# Patient Record
Sex: Female | Born: 1970 | Race: White | Hispanic: No | Marital: Single | Smoking: Never smoker
Health system: Southern US, Community
[De-identification: ages and names within clinical notes are randomized; demographics above are authoritative.]

## PROBLEM LIST (undated history)

## (undated) DIAGNOSIS — N83209 Unspecified ovarian cyst, unspecified side: Secondary | ICD-10-CM

---

## 2009-09-03 ENCOUNTER — Emergency Department: Payer: Self-pay | Admitting: Unknown Physician Specialty

## 2009-11-15 ENCOUNTER — Ambulatory Visit: Payer: Self-pay

## 2010-12-31 ENCOUNTER — Emergency Department: Payer: Self-pay | Admitting: *Deleted

## 2012-06-01 ENCOUNTER — Observation Stay: Payer: Self-pay | Admitting: Obstetrics & Gynecology

## 2012-06-01 LAB — URINALYSIS, COMPLETE
Bilirubin,UR: NEGATIVE
Blood: NEGATIVE
Glucose,UR: NEGATIVE mg/dL (ref 0–75)
Nitrite: NEGATIVE
Ph: 6 (ref 4.5–8.0)
Protein: NEGATIVE
Specific Gravity: 1.021 (ref 1.003–1.030)
Squamous Epithelial: 7
WBC UR: 13 /HPF (ref 0–5)

## 2012-06-01 LAB — DRUG SCREEN, URINE
Barbiturates, Ur Screen: NEGATIVE (ref ?–200)
Cocaine Metabolite,Ur ~~LOC~~: POSITIVE (ref ?–300)
MDMA (Ecstasy)Ur Screen: NEGATIVE (ref ?–500)
Methadone, Ur Screen: NEGATIVE (ref ?–300)
Opiate, Ur Screen: NEGATIVE (ref ?–300)
Phencyclidine (PCP) Ur S: NEGATIVE (ref ?–25)
Tricyclic, Ur Screen: NEGATIVE (ref ?–1000)

## 2012-07-25 ENCOUNTER — Observation Stay: Payer: Self-pay | Admitting: Obstetrics and Gynecology

## 2012-07-25 LAB — COMPREHENSIVE METABOLIC PANEL
Albumin: 2.4 g/dL — ABNORMAL LOW (ref 3.4–5.0)
Alkaline Phosphatase: 95 U/L (ref 50–136)
Anion Gap: 7 (ref 7–16)
Calcium, Total: 9.1 mg/dL (ref 8.5–10.1)
Chloride: 105 mmol/L (ref 98–107)
Co2: 25 mmol/L (ref 21–32)
Creatinine: 0.65 mg/dL (ref 0.60–1.30)
EGFR (Non-African Amer.): >60
Glucose: 74 mg/dL (ref 65–99)
SGOT(AST): 15 U/L (ref 15–37)
SGPT (ALT): 16 U/L (ref 12–78)
Sodium: 137 mmol/L (ref 136–145)
Total Protein: 6.4 g/dL (ref 6.4–8.2)

## 2012-07-25 LAB — LIPASE, BLOOD: Lipase: 109 U/L (ref 73–393)

## 2012-07-25 LAB — CBC WITH DIFFERENTIAL/PLATELET
Basophil #: 0 10*3/uL (ref 0.0–0.1)
Basophil %: 0.2 %
Eosinophil #: 0.2 10*3/uL (ref 0.0–0.7)
Eosinophil %: 2.2 %
HCT: 36.6 % (ref 35.0–47.0)
HGB: 13.1 g/dL (ref 12.0–16.0)
Lymphocyte %: 18.8 %
MCHC: 35.8 g/dL (ref 32.0–36.0)
MCV: 90 fL (ref 80–100)
Monocyte #: 0.8 x10 3/mm (ref 0.2–0.9)
Monocyte %: 8.6 %
Neutrophil #: 6.9 10*3/uL — ABNORMAL HIGH (ref 1.4–6.5)
Platelet: 202 10*3/uL (ref 150–440)
RDW: 12.7 % (ref 11.5–14.5)
WBC: 9.8 10*3/uL (ref 3.6–11.0)

## 2012-07-25 LAB — AMYLASE: Amylase: 71 U/L (ref 25–115)

## 2012-07-25 LAB — URINALYSIS, COMPLETE
Bacteria: NONE SEEN
Bilirubin,UR: NEGATIVE
Blood: NEGATIVE
Ketone: NEGATIVE
Ph: 7 (ref 4.5–8.0)
Protein: NEGATIVE
RBC,UR: 1 /HPF (ref 0–5)
Squamous Epithelial: 25
WBC UR: 3 /HPF (ref 0–5)

## 2012-09-06 ENCOUNTER — Ambulatory Visit: Payer: Self-pay | Admitting: Obstetrics and Gynecology

## 2012-09-06 LAB — CBC WITH DIFFERENTIAL/PLATELET
Basophil #: 0 10*3/uL (ref 0.0–0.1)
HCT: 38.8 % (ref 35.0–47.0)
HGB: 13.9 g/dL (ref 12.0–16.0)
Lymphocyte #: 1.6 10*3/uL (ref 1.0–3.6)
Lymphocyte %: 17.6 %
MCH: 32.4 pg (ref 26.0–34.0)
MCV: 91 fL (ref 80–100)
Neutrophil #: 6.5 10*3/uL (ref 1.4–6.5)
Platelet: 210 10*3/uL (ref 150–440)

## 2012-09-07 ENCOUNTER — Inpatient Hospital Stay: Payer: Self-pay | Admitting: Obstetrics and Gynecology

## 2012-09-08 LAB — HEMATOCRIT: HCT: 34.1 % — ABNORMAL LOW (ref 35.0–47.0)

## 2012-09-09 LAB — URINALYSIS, COMPLETE
Glucose,UR: NEGATIVE mg/dL (ref 0–75)
Ph: 5 (ref 4.5–8.0)
RBC,UR: 91 /HPF (ref 0–5)
Specific Gravity: 1.039 (ref 1.003–1.030)
WBC UR: 8 /HPF (ref 0–5)

## 2013-04-06 ENCOUNTER — Emergency Department: Payer: Self-pay | Admitting: Emergency Medicine

## 2013-04-27 ENCOUNTER — Ambulatory Visit: Payer: Self-pay | Admitting: Unknown Physician Specialty

## 2014-05-25 NOTE — Op Note (Signed)
PATIENT NAME:  Lauren Haas, GEETING MR#:  [id number] DATE OF BIRTH:  1970-07-15  DATE OF PROCEDURE:  09/07/2012  PREOPERATIVE DIAGNOSES:  1. Term intrauterine pregnancy at 13 weeks' 4 days' gestation.  2. Suspected fetal macrosomia based on recent growth ultrasound.  3. Polyhydramnios.   POSTOPERATIVE DIAGNOSES:  1. Term intrauterine pregnancy at 48 weeks' 4 days' gestation.  2. Suspected fetal macrosomia based on recent growth ultrasound.  3. Polyhydramnios.   OPERATION PERFORMED: Primary low transverse cesarean section via Pfannenstiel skin incision.   ANESTHESIA USED: Spinal.   PRIMARY SURGEON: Florina Ou. Bonney Aid, M.D.  ESTIMATED BLOOD LOSS: 500 mL.   OPERATIVE FLUIDS: 1500 mL of crystalloid.   URINE OUTPUT: 50 mL.   DRAINS OR TUBES: Foley to gravity drainage, On-Q catheter system.   IMPLANTS: None.   COMPLICATIONS: None.   PREOPERATIVE ANTIBIOTICS: Ancef 2 g.   INTRAOPERATIVE FINDINGS: Normal tubes, ovaries and uterus. Delivery resulted in the birth of a liveborn female infant weighing 4020 grams, weight 8 pounds 4 ounces, Apgars 8 and 9.   SPECIMENS REMOVED: None.   PATIENT CONDITION FOLLOWING THE PROCEDURE: Stable.   PROCEDURE IN DETAIL: Risks, benefits and alternatives of the procedure were discussed with the patient prior to proceeding to the operating room. We had discussed proceeding with induction of labor versus primary low transverse C-section given the estimated fetal weight of greater than 4500 grams at 38 weeks. The patient elected to proceed with primary low transverse C-section. During her pregnancy, the patient had undergone a routine Glucola which was normal. Random blood glucoses had also been normal. The patient was taken to the operating room where spinal anesthesia was administered. She was positioned in the supine position, prepped and draped in the usual sterile fashion. A timeout procedure was performed. A Pfannenstiel skin incision was made 2 cm above the  pubic symphysis and carried down sharply to the level of the rectus fascia using a knife. The fascia was incised in the midline and the fascial incision extended using Mayo scissors. The superior border of the rectus fascia was grasped with 2 Kocher clamps, dissected off the underlying rectus muscle using blunt dissection and median raphe was incised using Mayo scissors. The inferior border of the rectus fascia was dissected off the rectus muscle in a similar fashion. The midline was identified, entered bluntly and the perineal incision extended using manual traction. A bladder blade was placed. The bladder flap was then created. Following this, the bladder blade was replaced, and the bladder was retracted caudally. A low transverse incision was then made on the uterus. After scoring the uterus, the hysterotomy was entered bluntly using the operator's finger. The hysterotomy incision was then extended using manual traction. Upon performing amniotomy, copious amounts of fluid were noted consistent with the patient's diagnosis of polyhydramnios. The vertex was grasped, flexed, brought to the hysterotomy incision and delivered with the help of a vacuum. The remainder of the body delivered with ease. The infant was suctioned. Cord was clamped and cut, and the infant was passed to the awaiting pediatrician. Cord blood was obtained. The placenta was delivered using manual extraction. The uterus was then exteriorized, wiped clean of clots and debris with 2 moist laps. Hysterotomy closure was 2 layer with the first being a running locked, the second a vertical imbricating. The uterus was then returned to the abdomen. The peritoneal gutters were wiped clean of clots and debris using 2 moist laps. The hysterotomy incision was reinspected and noted to be hemostatic. The On-Q  catheters were then placed subfascially per the usual protocol. The fascia was closed using a looped #1 PDS in a running fashion. The subcutaneous dead  space was reapproximated using a 53T chromic. Following this, the skin was closed using Insorb staples. The On-Q catheters were bolused with 5 mL of 0.5% bupivacaine each. Sponge, needle and instrument counts were correct x2 at the conclusion of the case.   ____________________________ Florina OuAndreas M. Bonney AidStaebler, MD ams:gb D: 09/08/2012 19:22:13 ET T: 09/09/2012 03:39:13 ET JOB#: [id number]  cc: Florina OuAndreas M. Bonney AidStaebler, MD, <Dictator> Carmel SacramentoANDREAS Cathrine MusterM Anouk Critzer MD ELECTRONICALLY SIGNED 09/15/2012 20:30

## 2014-06-12 NOTE — H&P (Signed)
L&D Evaluation:  History:  HPI 44 yo G4P1021 @ 33.1wks EDC 09/10/12 by LMP presents by EMS with abdominal pain that is intermittent for 3 days.  She reports epigastric and lower abdominal pain this past Friday that started and became worse.  She tried an enema and food and it eventually resolved.  Today, the pain recurred.  She reports she ate a banana, chocolate milk, cherry pie, pretzels, cheese and ginger ale.  All of which caused the pain to worsen.  She has had some constipation but reports a normal BM today.  She vomited 3 days ago but has not vomited today.  She denies VB, LOF or ctxs.  PNC @ [facility] c/b obesity.   Presents with abdominal pain   Patient's Medical History No Chronic Illness   Patient's Surgical History Colecystectomy   Medications Pre Natal Vitamins   Allergies NKDA   Social History none   Family History DM   ROS:  ROS see HPI, all others reviewed and neg   Exam:  Vital Signs One elevated on arrival but all others WNL   Urine Protein not completed   General no apparent distress, Tearful   Mental Status clear   Chest clear   Heart normal sinus rhythm   Abdomen Gravid, tender to palpation in epigastric region   Pelvic Deferred   Mebranes Intact   FHT normal rate with no decels   Ucx absent   Skin dry   Impression:  Impression 33.1wk IUP, abdominal pain   Plan:  Plan EFM/NST   Comments Check CBC, CMP, amylase, lipase. GI cocktail.  If pain improved, likely GI cause.   Electronic Signatures: Senaida LangeWeaver-Lee, Ismeal Heider (MD)  (Signed 23-Jun-14 17:18)  Authored: L&D Evaluation   Last Updated: 23-Jun-14 17:18 by Senaida LangeWeaver-Lee, Akiva Brassfield (MD)

## 2014-06-12 NOTE — H&P (Signed)
L&D Evaluation:  History:  HPI 44yo [id number] at 25wks who presents with c/o bilateral lower abd pain with movement and upper abd pain/discomfort when standing.  Also c/o vaginal itching.  Good FM, but claims FM is often painful.  No VB, LOF or ctx.  Poor social situation - pt lives with alchoholic roomate and the FOB is in rehab for drug and EtOH addiction.  [facility], complicated by obestiy and AMA.   Presents with abdominal pain   Patient's Medical History obesity, PCOS   Patient's Surgical History Colecystectomy   Medications Pre Natal Vitamins  Tylenol (Acetaminophen)  tums   Allergies NKDA   Social History none   Family History Non-Contributory   ROS:  ROS per HPI   Exam:  Vital Signs stable   General no apparent distress, flinches with FM   Mental Status clear   Chest normal effort   Abdomen gravid, non-tender, large pannus   Edema no edema   Pelvic no external lesions, cervix closed and thick, thick white exudative discharge   Mebranes Intact   FHT normal rate with no decels   Ucx absent   Skin dry   Lymph no lymphadenopathy   Other UA: neg aside from 3+ leuks   Impression:  Impression 44yo G4P1021 at 25wks with candidiasis and normal discomforts of pregnancy complicated by obesity.   Plan:  Comments - rx terconazole cream x7d - reassurance provided - will attempt to set pt up with social work - [facility]   Electronic Signatures: Stansbury Clipp, Ali LoweEryn K (MD)  (Signed 30-Apr-14 15:25)  Authored: L&D Evaluation   Last Updated: 30-Apr-14 15:25 by Garnette GunnerStansbury Clipp, Ali LoweEryn K (MD)

## 2015-01-26 ENCOUNTER — Encounter: Payer: Self-pay | Admitting: *Deleted

## 2015-01-26 ENCOUNTER — Emergency Department
Admission: EM | Admit: 2015-01-26 | Discharge: 2015-01-26 | Disposition: A | Discharge status: Home / Self Care | Payer: Medicaid Other | Attending: Emergency Medicine | Admitting: Emergency Medicine

## 2015-01-26 DIAGNOSIS — M545 Low back pain, unspecified: Secondary | ICD-10-CM

## 2015-01-26 DIAGNOSIS — Z3202 Encounter for pregnancy test, result negative: Secondary | ICD-10-CM | POA: Insufficient documentation

## 2015-01-26 DIAGNOSIS — G8929 Other chronic pain: Secondary | ICD-10-CM | POA: Diagnosis not present

## 2015-01-26 HISTORY — DX: Unspecified ovarian cyst, unspecified side: N83.209

## 2015-01-26 LAB — URINALYSIS COMPLETE WITH MICROSCOPIC (ARMC ONLY)
Bilirubin Urine: NEGATIVE
GLUCOSE, UA: NEGATIVE mg/dL
Ketones, ur: NEGATIVE mg/dL
LEUKOCYTES UA: NEGATIVE
NITRITE: NEGATIVE
Protein, ur: 100 mg/dL — AB
SPECIFIC GRAVITY, URINE: 1.029 (ref 1.005–1.030)
pH: 5 (ref 5.0–8.0)

## 2015-01-26 LAB — CBC WITH DIFFERENTIAL/PLATELET
BASOS ABS: 0 10*3/uL (ref 0–0.1)
Basophils Relative: 1 %
EOS PCT: 5 %
Eosinophils Absolute: 0.3 10*3/uL (ref 0–0.7)
HCT: 42.7 % (ref 35.0–47.0)
HEMOGLOBIN: 14.8 g/dL (ref 12.0–16.0)
LYMPHS ABS: 2.2 10*3/uL (ref 1.0–3.6)
LYMPHS PCT: 34 %
MCH: 31 pg (ref 26.0–34.0)
MCHC: 34.5 g/dL (ref 32.0–36.0)
MCV: 89.6 fL (ref 80.0–100.0)
Monocytes Absolute: 0.6 10*3/uL (ref 0.2–0.9)
Monocytes Relative: 9 %
NEUTROS ABS: 3.3 10*3/uL (ref 1.4–6.5)
NEUTROS PCT: 51 %
PLATELETS: 273 10*3/uL (ref 150–440)
RBC: 4.76 MIL/uL (ref 3.80–5.20)
RDW: 12.6 % (ref 11.5–14.5)
WBC: 6.6 10*3/uL (ref 3.6–11.0)

## 2015-01-26 LAB — BASIC METABOLIC PANEL
ANION GAP: 7 (ref 5–15)
BUN: 20 mg/dL (ref 6–20)
CHLORIDE: 104 mmol/L (ref 101–111)
CO2: 28 mmol/L (ref 22–32)
Calcium: 8.6 mg/dL — ABNORMAL LOW (ref 8.9–10.3)
Creatinine, Ser: 0.87 mg/dL (ref 0.44–1.00)
GFR calc Af Amer: >60 mL/min (ref >60)
Glucose, Bld: 105 mg/dL — ABNORMAL HIGH (ref 65–99)
POTASSIUM: 4.2 mmol/L (ref 3.5–5.1)
SODIUM: 139 mmol/L (ref 135–145)

## 2015-01-26 LAB — POCT PREGNANCY, URINE: PREG TEST UR: NEGATIVE

## 2015-01-26 MED ORDER — GABAPENTIN 300 MG PO CAPS
ORAL_CAPSULE | ORAL | Status: AC
  Administered 2015-01-26: 300 mg via ORAL
  Filled 2015-01-26: qty 1

## 2015-01-26 MED ORDER — GABAPENTIN 300 MG PO CAPS
300.0000 mg | ORAL_CAPSULE | Freq: Every day | ORAL | Status: DC
  Administered 2015-01-26: 300 mg via ORAL

## 2015-01-26 MED ORDER — KETOROLAC TROMETHAMINE 60 MG/2ML IM SOLN
60.0000 mg | Freq: Once | INTRAMUSCULAR | Status: AC
  Administered 2015-01-26: 60 mg via INTRAMUSCULAR
  Filled 2015-01-26: qty 2

## 2015-01-26 MED ORDER — CEPHALEXIN 500 MG PO CAPS
500.0000 mg | ORAL_CAPSULE | Freq: Once | ORAL | Status: AC
  Administered 2015-01-26: 500 mg via ORAL
  Filled 2015-01-26: qty 1

## 2015-01-26 MED ORDER — GABAPENTIN 300 MG PO CAPS: 300.0000 mg | ORAL_CAPSULE | Freq: Three times a day (TID) | ORAL | Status: DC

## 2015-01-26 NOTE — ED Notes (Signed)
Pt reports chronic back pain, but worse over past 7 hours.  Pt denies any injury.  Pt in mild distress at this time, respirations fast.

## 2015-01-26 NOTE — ED Notes (Signed)
Pt has intermittent chronic back pain that is exacerbated by activity. Pt states she "laid in the bed too long" and "it was a soft bed". Pt denies acute injury. Pt states she used lidocaine ointment and took [employer], OTC w/o relief. Pt drove self to ED tonight.

## 2015-01-26 NOTE — Discharge Instructions (Signed)
Back Pain, Adult °Back pain is very common in adults. The cause of back pain is rarely dangerous and the pain often gets better over time. The cause of your back pain may not be known. Some common causes of back pain include: °· Strain of the muscles or ligaments supporting the spine. °· Wear and tear (degeneration) of the spinal disks. °· Arthritis. °· Direct injury to the back. °For many people, back pain may return. Since back pain is rarely dangerous, most people can learn to manage this condition on their own. °HOME CARE INSTRUCTIONS °Watch your back pain for any changes. The following actions may help to lessen any discomfort you are feeling: °· Remain active. It is stressful on your back to sit or stand in one place for long periods of time. Do not sit, drive, or stand in one place for more than 30 minutes at a time. Take short walks on even surfaces as soon as you are able. Try to increase the length of time you walk each day. °· Exercise regularly as directed by your health care provider. Exercise helps your back heal faster. It also helps avoid future injury by keeping your muscles strong and flexible. °· Do not stay in bed. Resting more than 1-2 days can delay your recovery. °· Pay attention to your body when you bend and lift. The most comfortable positions are those that put less stress on your recovering back. Always use proper lifting techniques, including: °¨ Bending your knees. °¨ Keeping the load close to your body. °¨ Avoiding twisting. °· Find a comfortable position to sleep. Use a firm mattress and lie on your side with your knees slightly bent. If you lie on your back, put a pillow under your knees. °· Avoid feeling anxious or stressed. Stress increases muscle tension and can worsen back pain. It is important to recognize when you are anxious or stressed and learn ways to manage it, such as with exercise. °· Take medicines only as directed by your health care provider. Over-the-counter  medicines to reduce pain and inflammation are often the most helpful. Your health care provider may prescribe muscle relaxant drugs. These medicines help dull your pain so you can more quickly return to your normal activities and healthy exercise. °· Apply ice to the injured area: °¨ Put ice in a plastic bag. °¨ Place a towel between your skin and the bag. °¨ Leave the ice on for 20 minutes, 2-3 times a day for the first 2-3 days. After that, ice and heat may be alternated to reduce pain and spasms. °· Maintain a healthy weight. Excess weight puts extra stress on your back and makes it difficult to maintain good posture. °SEEK MEDICAL CARE IF: °· You have pain that is not relieved with rest or medicine. °· You have increasing pain going down into the legs or buttocks. °· You have pain that does not improve in one week. °· You have night pain. °· You lose weight. °· You have a fever or chills. °SEEK IMMEDIATE MEDICAL CARE IF:  °· You develop new bowel or bladder control problems. °· You have unusual weakness or numbness in your arms or legs. °· You develop nausea or vomiting. °· You develop abdominal pain. °· You feel faint. °  °This information is not intended to replace advice given to you by your health care provider. Make sure you discuss any questions you have with your health care provider. °  °Document Released: 01/19/2005 Document Revised: 02/09/2014 Document Reviewed: 05/23/2013 °Elsevier Interactive Patient Education ©2016 [employer]  Take the gabapentin 1 today. One twice a day tomorrow and then one 3 times a day after that. The Keflex is one pill 4 times a day for 10 days for the UTI. Please return for worse pain fever vomiting or feeling sicker. Also return if you have any numbness or weakness of her leg or incontinence or anything you haven't had before. Please make sure to follow up with your regular doctor.

## 2015-01-26 NOTE — ED Provider Notes (Signed)
[facility] Emergency Department Provider Note  ____________________________________________  Time seen: Approximately 6:08 AM  I have reviewed the triage vital signs and the nursing notes.   HISTORY  Chief Complaint Back Pain    HPI Lauren Haas is a 44 y.o. female who reports chronic back pain. It flares up on occasion. It flared up today because she says she's laid in bed too long. There is no incontinence numbness or weakness in the legs. No back pain. Denies any fever. Patient reports the back pain improved in the past when she lost weight but now has gained weight back again and carrying her son also makes the pain worse and her son is 18 years old and 70 pounds.    This pain is her usual bad exacerbation of her usual chronic back pain  Past Medical History  Diagnosis Date  . Ovarian cyst     There are no active problems to display for this patient.   Past Surgical History  Procedure Laterality Date  . Cesarean section      Current Outpatient Rx  Name  Route  Sig  Dispense  Refill  . gabapentin (NEURONTIN) 300 MG capsule   Oral   Take 1 capsule (300 mg total) by mouth 3 (three) times daily. For tomorrow December 24 only take one twice a day and on the 25th Christmas Day take 13 times a day and continue with one 3 times a day after that   90 capsule   2     Allergies Review of patient's allergies indicates no known allergies.  History reviewed. No pertinent family history.  Social History Social History  Substance Use Topics  . Smoking status: Never Smoker   . Smokeless tobacco: Never Used  . Alcohol Use: No    Review of Systems Constitutional: No fever/chills Eyes: No visual changes. ENT: No sore throat. Cardiovascular: Denies chest pain. Respiratory: Denies shortness of breath. Gastrointestinal: No abdominal pain.  No nausea, no vomiting.  No diarrhea.  No constipation. Genitourinary: Negative for dysuria. Musculoskeletal:  Negative for back pain. Skin: Negative for rash. Neurological: Negative for headaches, focal weakness or numbness.  10-point ROS otherwise negative.  ____________________________________________   PHYSICAL EXAM:  VITAL SIGNS: ED Triage Vitals  Enc Vitals Group     BP 01/26/15 0346 121/79 mmHg     Pulse Rate 01/26/15 0346 67     Resp 01/26/15 0346 20     Temp 01/26/15 0346 97.5 F (36.4 C)     Temp Source 01/26/15 0346 Oral     SpO2 01/26/15 0346 99 %     Weight 01/26/15 0346 246 lb (111.585 kg)     Height 01/26/15 0346  (1.778 m)     Head Cir --      Peak Flow --      Pain Score 01/26/15 0347 9     Pain Loc --      Pain Edu? --      Excl. in GC? --     Constitutional: Alert and oriented. Well appearing and in no acute distress. Eyes: Conjunctivae are normal. PERRL. EOMI. Head: Atraumatic. Nose: No congestion/rhinnorhea. Mouth/Throat: Mucous membranes are moist.  Oropharynx non-erythematous. Neck: No stridor.  Cardiovascular: Normal rate, regular rhythm. Grossly normal heart sounds.  Good peripheral circulation. Respiratory: Normal respiratory effort.  No retractions. Lungs CTAB. Gastrointestinal: Soft and nontender. No distention. No abdominal bruits. No CVA tenderness. Musculoskeletal: No lower extremity tenderness nor edema.  No joint effusions. Pain  is across low back just above the pelvic brim. All the way across not particularly tender to palpation Neurologic:  Normal speech and language. No gross focal neurologic deficits are appreciated. No gait instability. Skin:  Skin is warm, dry and intact. No rash noted. Psychiatric: Mood and affect are normal. Speech and behavior are normal.  ____________________________________________   LABS (all labs ordered are listed, but only abnormal results are displayed)  Labs Reviewed  URINALYSIS COMPLETEWITH MICROSCOPIC ([facility] ONLY) - Abnormal; Notable for the following:    Color, Urine RED (*)    APPearance CLOUDY  (*)    Hgb urine dipstick 3+ (*)    Protein, ur 100 (*)    Bacteria, UA RARE (*)    Squamous Epithelial / LPF 6-30 (*)    All other components within normal limits  BASIC METABOLIC PANEL - Abnormal; Notable for the following:    Glucose, Bld 105 (*)    Calcium 8.6 (*)    All other components within normal limits  CBC WITH DIFFERENTIAL/PLATELET  POC URINE PREG, ED  POCT PREGNANCY, URINE   ____________________________________________  EKG   ____________________________________________  RADIOLOGY   ____________________________________________   PROCEDURES    ____________________________________________   INITIAL IMPRESSION / ASSESSMENT AND PLAN / ED COURSE  Pertinent labs & imaging results that were available during my care of the patient were reviewed by me and considered in my medical decision making (see chart for details).   ____________________________________________   FINAL CLINICAL IMPRESSION(S) / ED DIAGNOSES  Final diagnoses:  Midline low back pain without sciatica      Arnaldo NatalPaul F Malinda, MD 01/26/15 [id number]

## 2016-01-11 ENCOUNTER — Emergency Department
Admission: EM | Admit: 2016-01-11 | Discharge: 2016-01-11 | Disposition: A | Discharge status: Home / Self Care | Payer: Medicaid Other | Attending: Emergency Medicine | Admitting: Emergency Medicine

## 2016-01-11 ENCOUNTER — Encounter: Payer: Self-pay | Admitting: Emergency Medicine

## 2016-01-11 ENCOUNTER — Emergency Department: Payer: Medicaid Other

## 2016-01-11 DIAGNOSIS — J209 Acute bronchitis, unspecified: Secondary | ICD-10-CM

## 2016-01-11 DIAGNOSIS — R05 Cough: Secondary | ICD-10-CM | POA: Diagnosis present

## 2016-01-11 MED ORDER — ALBUTEROL SULFATE HFA 108 (90 BASE) MCG/ACT IN AERS: 1.0000 | INHALATION_SPRAY | Freq: Four times a day (QID) | RESPIRATORY_TRACT | 0 refills | Status: AC | PRN

## 2016-01-11 MED ORDER — PREDNISONE 10 MG PO TABS: ORAL_TABLET | ORAL | 0 refills | Status: DC

## 2016-01-11 MED ORDER — AZITHROMYCIN 250 MG PO TABS: ORAL_TABLET | ORAL | 0 refills | Status: DC

## 2016-01-11 MED ORDER — PROMETHAZINE-DM 6.25-15 MG/5ML PO SYRP: 5.0000 mL | ORAL_SOLUTION | Freq: Four times a day (QID) | ORAL | 0 refills | Status: DC | PRN

## 2016-01-11 MED ORDER — FLUTICASONE PROPIONATE 50 MCG/ACT NA SUSP: 2.0000 | Freq: Every day | NASAL | 0 refills | Status: AC

## 2016-01-11 MED ORDER — METHYLPREDNISOLONE SODIUM SUCC 125 MG IJ SOLR
60.0000 mg | Freq: Once | INTRAMUSCULAR | Status: AC
  Administered 2016-01-11: 60 mg via INTRAMUSCULAR
  Filled 2016-01-11: qty 2

## 2016-01-11 NOTE — ED Notes (Signed)
Pt states cough, N&V, diarrhea x 3 weeks. Denies fever.

## 2016-01-11 NOTE — ED Triage Notes (Signed)
Cough x 3 weeks.Not sure if she has been febrile.

## 2016-01-11 NOTE — ED Provider Notes (Signed)
[facility] Emergency Department Provider Note  ____________________________________________  Time seen: Approximately 4:52 PM  I have reviewed the triage vital signs and the nursing notes.   HISTORY  Chief Complaint Cough    HPI Lauren Haas is a 45 y.o. female , NAD, presents to the emergency department with 3 week history of cough, chest congestion. Patient states she has had cough, chest congestion for approximately 3 weeks with several episodes of posttussive emesis. States she has had no nausea or vomiting since yesterday. Notes 1 episode of diarrhea 2 days ago that has now resolved. Has felt feverish but has not checked her temperature. Denies any chills but has had fatigue. Has had no chest pain but has shortness of breath with cough only. Has not noted any wheezing. Denies any abdominal pain. Has had accompanying nasal congestion, runny nose and headaches. No numbness, wheeze, tingling. No dizziness or lightheadedness.Patient does note she has been exposed to family members with confirmed pneumonia in the last couple of weeks. No other known sick contacts.   Past Medical History:  Diagnosis Date  . Ovarian cyst     There are no active problems to display for this patient.   Past Surgical History:  Procedure Laterality Date  . CESAREAN SECTION      Prior to Admission medications   Medication Sig Start Date End Date Taking? Authorizing Provider  albuterol (PROVENTIL HFA;VENTOLIN HFA) 108 (90 Base) MCG/ACT inhaler Inhale 1-2 puffs into the lungs every 6 (six) hours as needed for wheezing or shortness of breath. 01/11/16   Shahad Mazurek L Jesiel Garate, PA-C  azithromycin (ZITHROMAX Z-PAK) 250 MG tablet Take 2 tablets (500 mg) on  Day 1,  followed by 1 tablet (250 mg) once daily on Days 2 through 5. 01/11/16   Kivon Aprea L Sidnie Swalley, PA-C  fluticasone (FLONASE) 50 MCG/ACT nasal spray Place 2 sprays into both nostrils daily. 01/11/16   Kalman Nylen L Cattleya Dobratz, PA-C  gabapentin (NEURONTIN)  300 MG capsule Take 1 capsule (300 mg total) by mouth 3 (three) times daily. For tomorrow December 24 only take one twice a day and on the 25th Christmas Day take 13 times a day and continue with one 3 times a day after that 01/26/15 01/26/16  Arnaldo Natal, MD  predniSONE (DELTASONE) 10 MG tablet Take a daily regimen of 6,5,4,3,2,1 01/11/16   Najwa Spillane L Issis Lindseth, PA-C  promethazine-dextromethorphan (PROMETHAZINE-DM) 6.25-15 MG/5ML syrup Take 5 mLs by mouth 4 (four) times daily as needed for cough. 01/11/16   Swain Acree L Tyquez Hollibaugh, PA-C    Allergies Patient has no known allergies.  No family history on file.  Social History Social History  Substance Use Topics  . Smoking status: Never Smoker  . Smokeless tobacco: Never Used  . Alcohol use No     Review of Systems  Constitutional: Positive subjective fevers and fatigue. No chills or rigors. Eyes: No visual changes. No discharge ENT: Positive nasal congestion, runny nose, sinus pressure. No ear pain, ear drainage, sore throat. Cardiovascular: No chest pain. Respiratory: Positive cough, chest congestion, shortness of breath. No wheezing. Gastrointestinal: Positive posttussive emesis. Positive diarrhea that has resolved. No abdominal pain.  No constipation. Genitourinary: Negative for dysuria, hematuria. No urinary hesitancy, urgency or increased frequency. Musculoskeletal: Negative for back pain.  Skin: Negative for rash. Neurological: Positive for headaches, but no focal weakness or numbness. No lightheadedness, dizziness. 10-point ROS otherwise negative.  ____________________________________________   PHYSICAL EXAM:  VITAL SIGNS: ED Triage Vitals  Enc Vitals Group  BP 01/11/16 1612 137/78     Pulse Rate 01/11/16 1612 76     Resp 01/11/16 1612 18     Temp 01/11/16 1612 98.6 F (37 C)     Temp Source 01/11/16 1612 Oral     SpO2 01/11/16 1612 100 %     Weight 01/11/16 1614 232 lb (105.2 kg)     Height 01/11/16 1614 5\' 10"  (1.778 m)      Head Circumference --      Peak Flow --      Pain Score 01/11/16 1614 7     Pain Loc --      Pain Edu? --      Excl. in GC? --      Constitutional: Alert and oriented. Ill appearing but in no acute distress. Eyes: Conjunctivae are normal without icterus, injection or discharge. Head: Atraumatic. ENT:      Ears: TMs visualized bilaterally with mild effusion and mild bulging but no erythema or perforation.      Nose: Moderate congestion with clear rhinorrhea.      Mouth/Throat: Mucous membranes are moist. Pharynx without erythema, swelling, exudate. Uvula is midline. Airway is patent. Clear postnasal drip. Neck: Supple with full range of motion. Hematological/Lymphatic/Immunilogical: No cervical lymphadenopathy. Cardiovascular: Normal rate, regular rhythm. Normal S1 and S2.  Good peripheral circulation. Respiratory: Normal respiratory effort without tachypnea or retractions. Lungs CTAB with breath sounds noted in all lung fields. No wheeze, rhonchi, rales. Gastrointestinal: Soft and nontender without distention or guarding in all quadrants. No rebound or rigidity. No masses or hepatosplenomegaly. Bowel sounds are grossly normal active in all quadrants. Musculoskeletal: Full range of motion of bilateral upper and lower extremities without pain or difficulty. Neurologic:  Normal speech and language. No gross focal neurologic deficits are appreciated.  Skin:  Skin is warm, dry and intact. No rash noted. Psychiatric: Mood and affect are normal. Speech and behavior are normal. Patient exhibits appropriate insight and judgement.   ____________________________________________   LABS  None ____________________________________________  EKG  None ____________________________________________  RADIOLOGY I, Hope PigeonJami L Turki Tapanes, personally viewed and evaluated these images (plain radiographs) as part of my medical decision making, as well as reviewing the written report by the radiologist.  Dg  Chest 2 View  Result Date: 01/11/2016 CLINICAL DATA:  Pt states cough, NANDV, diarrhea x 3 weeks. Denies fever. Shielded. EXAM: CHEST  2 VIEW COMPARISON:  None. FINDINGS: The heart size and mediastinal contours are within normal limits. Both lungs are clear. The visualized skeletal structures are unremarkable. IMPRESSION: No active cardiopulmonary disease. Electronically Signed   By: Norva PavlovElizabeth  Brown M.D.   On: 01/11/2016 17:34    ____________________________________________    PROCEDURES  Procedure(s) performed: None   Procedures   Medications  methylPREDNISolone sodium succinate (SOLU-MEDROL) 125 mg/2 mL injection 60 mg (60 mg Intramuscular Given 01/11/16 1727)     ____________________________________________   INITIAL IMPRESSION / ASSESSMENT AND PLAN / ED COURSE  Pertinent labs & imaging results that were available during my care of the patient were reviewed by me and considered in my medical decision making (see chart for details).  Clinical Course     Patient's diagnosis is consistent with Acute bronchitis. Patient will be discharged home with prescriptions for albuterol inhaler, azithromycin, Flonase, prednisone and promethazine DM cough syrup to take as directed. Patient is to follow up with her primary care provider if symptoms persist past this treatment course. Patient is given ED precautions to return to the ED for any worsening  or new symptoms.   ____________________________________________  FINAL CLINICAL IMPRESSION(S) / ED DIAGNOSES  Final diagnoses:  Acute bronchitis, unspecified organism      NEW MEDICATIONS STARTED DURING THIS VISIT:  New Prescriptions   ALBUTEROL (PROVENTIL HFA;VENTOLIN HFA) 108 (90 BASE) MCG/ACT INHALER    Inhale 1-2 puffs into the lungs every 6 (six) hours as needed for wheezing or shortness of breath.   AZITHROMYCIN (ZITHROMAX Z-PAK) 250 MG TABLET    Take 2 tablets (500 mg) on  Day 1,  followed by 1 tablet (250 mg) once daily on  Days 2 through 5.   FLUTICASONE (FLONASE) 50 MCG/ACT NASAL SPRAY    Place 2 sprays into both nostrils daily.   PREDNISONE (DELTASONE) 10 MG TABLET    Take a daily regimen of 6,5,4,3,2,1   PROMETHAZINE-DEXTROMETHORPHAN (PROMETHAZINE-DM) 6.25-15 MG/5ML SYRUP    Take 5 mLs by mouth 4 (four) times daily as needed for cough.         Hope PigeonJami L Makaleigh Reinard, PA-C 01/11/16 1752    Jennye MoccasinBrian S Quigley, MD 01/11/16 [phone]

## 2016-03-26 ENCOUNTER — Emergency Department: Payer: Medicaid Other

## 2016-03-26 ENCOUNTER — Emergency Department
Admission: EM | Admit: 2016-03-26 | Discharge: 2016-03-26 | Disposition: A | Discharge status: Home / Self Care | Payer: Medicaid Other | Attending: Emergency Medicine | Admitting: Emergency Medicine

## 2016-03-26 DIAGNOSIS — R05 Cough: Secondary | ICD-10-CM

## 2016-03-26 DIAGNOSIS — R0981 Nasal congestion: Secondary | ICD-10-CM | POA: Diagnosis not present

## 2016-03-26 DIAGNOSIS — R059 Cough, unspecified: Secondary | ICD-10-CM

## 2016-03-26 MED ORDER — GUAIFENESIN-CODEINE 100-10 MG/5ML PO SYRP: 5.0000 mL | ORAL_SOLUTION | Freq: Three times a day (TID) | ORAL | 0 refills | Status: AC | PRN

## 2016-03-26 MED ORDER — AZITHROMYCIN 500 MG PO TABS
500.0000 mg | ORAL_TABLET | Freq: Once | ORAL | Status: AC
  Administered 2016-03-26: 500 mg via ORAL
  Filled 2016-03-26: qty 1

## 2016-03-26 MED ORDER — PREDNISONE 10 MG (21) PO TBPK: ORAL_TABLET | ORAL | 0 refills | Status: DC

## 2016-03-26 MED ORDER — GUAIFENESIN-DM 100-10 MG/5ML PO SYRP
5.0000 mL | ORAL_SOLUTION | Freq: Once | ORAL | Status: AC
  Administered 2016-03-26: 5 mL via ORAL
  Filled 2016-03-26: qty 5

## 2016-03-26 MED ORDER — AZITHROMYCIN 250 MG PO TABS: ORAL_TABLET | ORAL | 0 refills | Status: DC

## 2016-03-26 MED ORDER — IPRATROPIUM-ALBUTEROL 0.5-2.5 (3) MG/3ML IN SOLN
3.0000 mL | Freq: Once | RESPIRATORY_TRACT | Status: AC
  Administered 2016-03-26: 3 mL via RESPIRATORY_TRACT
  Filled 2016-03-26: qty 3

## 2016-03-26 MED ORDER — PREDNISONE 20 MG PO TABS
60.0000 mg | ORAL_TABLET | Freq: Once | ORAL | Status: AC
  Administered 2016-03-26: 60 mg via ORAL
  Filled 2016-03-26: qty 3

## 2016-03-26 NOTE — ED Triage Notes (Signed)
Pt arrives to triage via [facility] with reports of cough and congestion for the last couple of days

## 2016-03-26 NOTE — ED Provider Notes (Signed)
[facility] Emergency Department Provider Note  ____________________________________________  Time seen: Approximately 2:02 PM  I have reviewed the triage vital signs and the nursing notes.   HISTORY  Chief Complaint Cough and Nasal Congestion    HPI Lauren Haas is a 46 y.o. female that presents to emergency department with productive cough for 4 months and congestion for a couple of days. Patient is coughing up beige sputum. Patient states that occasionally she coughs so hard that she vomits. She states that she was treated for bronchitis 3 months ago. She states the symptoms improved and then returned. No alleviating measures have been tried. Patient does not smoke. No history of allergies or asthma. Patient denies fever, sore throat, shortness of breath, chest pain, nausea, abdominal pain, diarrhea, constipation.   Past Medical History:  Diagnosis Date  . Ovarian cyst     There are no active problems to display for this patient.   Past Surgical History:  Procedure Laterality Date  . CESAREAN SECTION      Prior to Admission medications   Medication Sig Start Date End Date Taking? Authorizing Provider  albuterol (PROVENTIL HFA;VENTOLIN HFA) 108 (90 Base) MCG/ACT inhaler Inhale 1-2 puffs into the lungs every 6 (six) hours as needed for wheezing or shortness of breath. 01/11/16   Jami L Hagler, PA-C  azithromycin (ZITHROMAX Z-PAK) 250 MG tablet Take 2 tablets (500 mg) on  Day 1,  followed by 1 tablet (250 mg) once daily on Days 2 through 5. 03/26/16   Enid DerryAshley Jadian Karman, PA-C  fluticasone (FLONASE) 50 MCG/ACT nasal spray Place 2 sprays into both nostrils daily. 01/11/16   Jami L Hagler, PA-C  gabapentin (NEURONTIN) 300 MG capsule Take 1 capsule (300 mg total) by mouth 3 (three) times daily. For tomorrow December 24 only take one twice a day and on the 25th Christmas Day take 13 times a day and continue with one 3 times a day after that 01/26/15 01/26/16  Arnaldo NatalPaul F  Malinda, MD  guaiFENesin-codeine [facility](ROBITUSSIN AC) 100-10 MG/5ML syrup Take 5 mLs by mouth 3 (three) times daily as needed for cough. 03/26/16 03/28/16  Enid DerryAshley Sharyon Peitz, PA-C  predniSONE (STERAPRED UNI-PAK 21 TAB) 10 MG (21) TBPK tablet Take 6 tablets on day 1, take 5 tablets on day 2, take 4 tablets on day 3, take 3 tablets on day 4, take 2 tablets on day 5, take 1 tablet on day 6 03/26/16   Enid DerryAshley Melayah Skorupski, PA-C  promethazine-dextromethorphan (PROMETHAZINE-DM) 6.25-15 MG/5ML syrup Take 5 mLs by mouth 4 (four) times daily as needed for cough. 01/11/16   Jami L Hagler, PA-C    Allergies Patient has no known allergies.  No family history on file.  Social History Social History  Substance Use Topics  . Smoking status: Never Smoker  . Smokeless tobacco: Never Used  . Alcohol use No     Review of Systems  Constitutional: No fever/chills Eyes: No visual changes. No discharge. ENT: Positive for congestion and rhinorrhea. Cardiovascular: No chest pain. Respiratory: Positive for cough. No SOB. Gastrointestinal: No abdominal pain.  No nausea.  No diarrhea.  No constipation. Musculoskeletal: Negative for musculoskeletal pain. Skin: Negative for rash, abrasions, lacerations, ecchymosis. Neurological: Negative for headaches.   ____________________________________________   PHYSICAL EXAM:  VITAL SIGNS: ED Triage Vitals  Enc Vitals Group     BP 03/26/16 1242 128/69     Pulse Rate 03/26/16 1242 100     Resp 03/26/16 1242 18     Temp 03/26/16 1242 98.7  F (37.1 C)     Temp Source 03/26/16 1242 Oral     SpO2 03/26/16 1242 98 %     Weight 03/26/16 1242 228 lb (103.4 kg)     Height 03/26/16 1242 5\' 10"  (1.778 m)     Head Circumference --      Peak Flow --      Pain Score 03/26/16 1239 5     Pain Loc --      Pain Edu? --      Excl. in GC? --      Constitutional: Alert and oriented. Well appearing and in no acute distress. Eyes: Conjunctivae are normal. PERRL. EOMI. No discharge. Head:  Atraumatic. ENT: No frontal and maxillary sinus tenderness.      Ears: Tympanic membranes pearly gray with good landmarks. No discharge.      Nose: Mild congestion/rhinnorhea.      Mouth/Throat: Mucous membranes are moist. Oropharynx non-erythematous. Tonsils not enlarged. No exudates. Uvula midline. Neck: No stridor.   Hematological/Lymphatic/Immunilogical: No cervical lymphadenopathy. Cardiovascular: Normal rate, regular rhythm.  Good peripheral circulation. Respiratory: Normal respiratory effort without tachypnea or retractions. Lungs CTAB. Good air entry to the bases with no decreased or absent breath sounds. Gastrointestinal: Bowel sounds 4 quadrants. Soft and nontender to palpation. No guarding or rigidity. No palpable masses. No distention. Musculoskeletal: Full range of motion to all extremities. No gross deformities appreciated. Neurologic:  Normal speech and language. No gross focal neurologic deficits are appreciated.  Skin:  Skin is warm, dry and intact. No rash noted. Psychiatric: Mood and affect are normal. Speech and behavior are normal. Patient exhibits appropriate insight and judgement.   ____________________________________________   LABS (all labs ordered are listed, but only abnormal results are displayed)  Labs Reviewed - No data to display ____________________________________________  EKG   ____________________________________________  RADIOLOGY Lexine Baton, personally viewed and evaluated these images (plain radiographs) as part of my medical decision making, as well as reviewing the written report by the radiologist.  Dg Chest 2 View  Result Date: 03/26/2016 CLINICAL DATA:  Productive cough for 4 months. EXAM: CHEST  2 VIEW COMPARISON:  01/11/2016 FINDINGS: Cardiomediastinal silhouette is normal. Mediastinal contours appear intact. There is no evidence of scratched pleural effusion or pneumothorax. There is an airspace opacity in the right middle lobe  with linear peribronchial extension. Osseous structures are without acute abnormality. Soft tissues are grossly normal. IMPRESSION: Airspace opacity in the right middle lobe with peribronchial extension. This may represent infectious airspace consolidation, round atelectasis or pulmonary mass. Follow-up to resolution after empiric treatment is recommended, to exclude underlying mass. Electronically Signed   By: Ted Mcalpine M.D.   On: 03/26/2016 14:40    ____________________________________________    PROCEDURES  Procedure(s) performed:    Procedures    Medications  guaiFENesin-dextromethorphan (ROBITUSSIN DM) 100-10 MG/5ML syrup 5 mL (not administered)  predniSONE (DELTASONE) tablet 60 mg (not administered)  azithromycin (ZITHROMAX) tablet 500 mg (not administered)  ipratropium-albuterol (DUONEB) 0.5-2.5 (3) MG/3ML nebulizer solution 3 mL (3 mLs Nebulization Given 03/26/16 1430)     ____________________________________________   INITIAL IMPRESSION / ASSESSMENT AND PLAN / ED COURSE  Pertinent labs & imaging results that were available during my care of the patient were reviewed by me and considered in my medical decision making (see chart for details).  Review of the [place] CSRS was performed in accordance of the [facility] prior to dispensing any controlled drugs.     Patient presented to the emergency department with cough  for 4 months. Vital signs and exam are reassuring. Patient was given DuoNeb in the emergency department and felt better after treatment. X-ray indicated airspace consolidation in right middle lobe that may represent infectious airspace consolidation, atelectasis, pulmonary mass. This information was disclosed to patient. Patient was given Robitussin, prednisone, azithromycin in ED. Patient will be given prescription for each of these. Patient was instructed to follow up with PCP in one week for symptom resolution. Patient was informed about concern for pulmonary  mass and that following up in one week is essential. Patient feels comfortable going home. Patient will be discharged home with prescriptions for azithromycin, prednisone, Robitussin. Patient is to follow up with PCP as needed or otherwise directed. Patient is given ED precautions to return to the ED for any worsening or new symptoms.     ____________________________________________  FINAL CLINICAL IMPRESSION(S) / ED DIAGNOSES  Final diagnoses:  Cough      NEW MEDICATIONS STARTED DURING THIS VISIT:  New Prescriptions   AZITHROMYCIN (ZITHROMAX Z-PAK) 250 MG TABLET    Take 2 tablets (500 mg) on  Day 1,  followed by 1 tablet (250 mg) once daily on Days 2 through 5.   GUAIFENESIN-CODEINE (ROBITUSSIN AC) 100-10 MG/5ML SYRUP    Take 5 mLs by mouth 3 (three) times daily as needed for cough.   PREDNISONE (STERAPRED UNI-PAK 21 TAB) 10 MG (21) TBPK TABLET    Take 6 tablets on day 1, take 5 tablets on day 2, take 4 tablets on day 3, take 3 tablets on day 4, take 2 tablets on day 5, take 1 tablet on day 6        This chart was dictated using voice recognition software/Dragon. Despite best efforts to proofread, errors can occur which can change the meaning. Any change was purely unintentional.    Enid Derry, PA-C 03/26/16 1607    Sharman Cheek, MD 03/31/16 1110

## 2016-03-26 NOTE — ED Notes (Signed)
Pt c/o cough x 4 months, states for the last month has been coughing up beige/green mucous. Pt states she "is just not feeling any better". Pt is noted to have nasal congestion at this time. States today became dizzy after donating plasma today.

## 2016-05-02 ENCOUNTER — Emergency Department
Admission: EM | Admit: 2016-05-02 | Discharge: 2016-05-02 | Disposition: A | Discharge status: Home / Self Care | Payer: Medicaid Other | Attending: Emergency Medicine | Admitting: Emergency Medicine

## 2016-05-02 ENCOUNTER — Emergency Department: Payer: Medicaid Other

## 2016-05-02 DIAGNOSIS — F419 Anxiety disorder, unspecified: Secondary | ICD-10-CM

## 2016-05-02 DIAGNOSIS — J40 Bronchitis, not specified as acute or chronic: Secondary | ICD-10-CM | POA: Diagnosis not present

## 2016-05-02 DIAGNOSIS — R05 Cough: Secondary | ICD-10-CM | POA: Diagnosis present

## 2016-05-02 DIAGNOSIS — Z79899 Other long term (current) drug therapy: Secondary | ICD-10-CM | POA: Diagnosis not present

## 2016-05-02 MED ORDER — ALBUTEROL SULFATE HFA 108 (90 BASE) MCG/ACT IN AERS: 2.0000 | INHALATION_SPRAY | Freq: Four times a day (QID) | RESPIRATORY_TRACT | 0 refills | Status: AC | PRN

## 2016-05-02 NOTE — ED Triage Notes (Signed)
Patient reports "I'm having a hard time breathing with my coughing.  I've been coughing for awhile."  Patient reports seen in ED for the same, but has not followed up as instructed.

## 2016-05-02 NOTE — ED Notes (Signed)
Pt assisted to wheelchair upon arrival; c/o shortness of breath, cough and chest pain; vomited once with coughing spell; pt says she was here several weeks and told she may have pneumonia; did not follow up with MD "like I was told to"

## 2016-05-02 NOTE — ED Provider Notes (Signed)
[facility] Emergency Department Provider Note   ____________________________________________   I have reviewed the triage vital signs and the nursing notes.   HISTORY  Chief Complaint Shortness of Breath   History limited by: Not Limited   HPI Lauren Haas is a 46 y.o. female who presents to the emergency department today because of concerns for continued cough. The patient states she has had a cough for 4 months. It is productive of nonbloody phlegm. She states she does have some discomfort in her chest now from the coughing. Addition she states she has had a couple episodes of vomiting after the coughing. Patient was seen in the emergency department roughly 1 month ago and diagnosed with a pneumonia. She states she took the medication at that time but did not follow up with primary care. In addition the patient states she has lots of life stressors right now and is working on seeing a therapist. She denies any suicidal ideation or thoughts of self-harm.   Past Medical History:  Diagnosis Date  . Ovarian cyst     There are no active problems to display for this patient.   Past Surgical History:  Procedure Laterality Date  . CESAREAN SECTION      Prior to Admission medications   Medication Sig Start Date End Date Taking? Authorizing Provider  albuterol (PROVENTIL HFA;VENTOLIN HFA) 108 (90 Base) MCG/ACT inhaler Inhale 1-2 puffs into the lungs every 6 (six) hours as needed for wheezing or shortness of breath. 01/11/16   Jami L Hagler, PA-C  azithromycin (ZITHROMAX Z-PAK) 250 MG tablet Take 2 tablets (500 mg) on  Day 1,  followed by 1 tablet (250 mg) once daily on Days 2 through 5. 03/26/16   Enid Derry, PA-C  fluticasone (FLONASE) 50 MCG/ACT nasal spray Place 2 sprays into both nostrils daily. 01/11/16   Jami L Hagler, PA-C  gabapentin (NEURONTIN) 300 MG capsule Take 1 capsule (300 mg total) by mouth 3 (three) times daily. For tomorrow December 24 only  take one twice a day and on the 25th Christmas Day take 13 times a day and continue with one 3 times a day after that 01/26/15 01/26/16  Arnaldo Natal, MD  predniSONE (STERAPRED UNI-PAK 21 TAB) 10 MG (21) TBPK tablet Take 6 tablets on day 1, take 5 tablets on day 2, take 4 tablets on day 3, take 3 tablets on day 4, take 2 tablets on day 5, take 1 tablet on day 6 03/26/16   Enid Derry, PA-C  promethazine-dextromethorphan (PROMETHAZINE-DM) 6.25-15 MG/5ML syrup Take 5 mLs by mouth 4 (four) times daily as needed for cough. 01/11/16   Jami L Hagler, PA-C    Allergies Patient has no known allergies.  No family history on file.  Social History Social History  Substance Use Topics  . Smoking status: Never Smoker  . Smokeless tobacco: Never Used  . Alcohol use No    Review of Systems  Constitutional: Negative for fever. Cardiovascular: Positive for chest pain. Respiratory: Positive for shortness of breath and cough. Gastrointestinal: Negative for abdominal pain, vomiting and diarrhea. Neurological: Negative for headaches, focal weakness or numbness.  10-point ROS otherwise negative.  ____________________________________________   PHYSICAL EXAM:  VITAL SIGNS: ED Triage Vitals  Enc Vitals Group     BP 05/02/16 0609 (!) 193/107     Pulse Rate 05/02/16 0609 (!) 104     Resp 05/02/16 0609 (!) 22     Temp 05/02/16 0609 98.3 F (36.8 C)  Temp Source 05/02/16 0609 Oral     SpO2 05/02/16 0609 99 %     Weight 05/02/16 0611 246 lb (111.6 kg)     Height 05/02/16 0611  (1.778 m)   Constitutional: Alert and oriented. Tearful. Eyes: Conjunctivae are normal. Normal extraocular movements. ENT   Head: Normocephalic and atraumatic.   Nose: No congestion/rhinnorhea.   Mouth/Throat: Mucous membranes are moist.   Neck: No stridor. Hematological/Lymphatic/Immunilogical: No cervical lymphadenopathy. Cardiovascular: Normal rate, regular rhythm.  No murmurs, rubs, or  gallops.  Respiratory: Normal respiratory effort without tachypnea nor retractions. Breath sounds are clear and equal bilaterally. No wheezes/rales/rhonchi. Gastrointestinal: Soft and non tender. No rebound. No guarding.  Genitourinary: Deferred Musculoskeletal: Normal range of motion in all extremities. No lower extremity edema. Neurologic:  Normal speech and language. No gross focal neurologic deficits are appreciated.  Skin:  Skin is warm, dry and intact. No rash noted. Psychiatric: Tearful, patient denies SI  ____________________________________________    LABS (pertinent positives/negatives)  None  ____________________________________________   EKG  None  ____________________________________________    RADIOLOGY  CXR IMPRESSION:  No acute abnormality. Previous rounded density in the right middle  lobe has near completely resolved with minimal residual atelectasis  or scarring.      ____________________________________________   PROCEDURES  Procedures  ____________________________________________   INITIAL IMPRESSION / ASSESSMENT AND PLAN / ED COURSE  Pertinent labs & imaging results that were available during my care of the patient were reviewed by me and considered in my medical decision making (see chart for details).  Patient presented to the emergency department today because of concerns for continued cough and shortness of breath. Chest x-ray today shows good resolution of the infiltrate noted on previous x-ray. Lung sounds were clear. Will trial patient on albuterol inhaler. In addition the patient was quite tearful during exam. There are a lot of life stressors particularly the fact that the patient is homeless and currently no longer lives with her children. This point she denies any thoughts of self-harm or suicidal ideation. She is working with [employer]. I encourage patient to continue to work on following  up.  ____________________________________________   FINAL CLINICAL IMPRESSION(S) / ED DIAGNOSES  Final diagnoses:  Bronchitis  Anxiety     Note: This dictation was prepared with Dragon dictation. Any transcriptional errors that result from this process are unintentional     Phineas Semen, MD 05/02/16 [phone]

## 2016-05-02 NOTE — ED Notes (Signed)
Pt in room hyperventilating upon entering. States having a hard time with personal issues. Tearful. D/c instruction given. Pt states needs a few minutes to calm down and not have a panic attack.

## 2016-05-02 NOTE — Discharge Instructions (Signed)
Please seek medical attention for any high fevers, chest pain, shortness of breath, change in behavior, persistent vomiting, bloody stool or any other new or concerning symptoms.  

## 2017-08-26 ENCOUNTER — Emergency Department
Admission: EM | Admit: 2017-08-26 | Discharge: 2017-08-26 | Disposition: A | Discharge status: Home / Self Care | Payer: Self-pay | Attending: Emergency Medicine | Admitting: Emergency Medicine

## 2017-08-26 ENCOUNTER — Other Ambulatory Visit: Payer: Self-pay

## 2017-08-26 ENCOUNTER — Encounter: Payer: Self-pay | Admitting: Emergency Medicine

## 2017-08-26 DIAGNOSIS — L509 Urticaria, unspecified: Secondary | ICD-10-CM | POA: Insufficient documentation

## 2017-08-26 MED ORDER — DEXAMETHASONE SODIUM PHOSPHATE 10 MG/ML IJ SOLN
10.0000 mg | Freq: Once | INTRAMUSCULAR | Status: AC
  Administered 2017-08-26: 10 mg via INTRAMUSCULAR
  Filled 2017-08-26: qty 1

## 2017-08-26 MED ORDER — HYDROXYZINE HCL 25 MG PO TABS
25.0000 mg | ORAL_TABLET | Freq: Once | ORAL | Status: AC
  Administered 2017-08-26: 25 mg via ORAL
  Filled 2017-08-26: qty 1

## 2017-08-26 MED ORDER — HYDROXYZINE PAMOATE 25 MG PO CAPS: 25.0000 mg | ORAL_CAPSULE | Freq: Three times a day (TID) | ORAL | 0 refills | Status: AC | PRN

## 2017-08-26 MED ORDER — RANITIDINE HCL 150 MG PO TABS: 150.0000 mg | ORAL_TABLET | Freq: Two times a day (BID) | ORAL | 0 refills | Status: AC

## 2017-08-26 MED ORDER — PREDNISONE 10 MG PO TABS: 10.0000 mg | ORAL_TABLET | Freq: Two times a day (BID) | ORAL | 0 refills | Status: AC

## 2017-08-26 MED ORDER — FAMOTIDINE 20 MG PO TABS
40.0000 mg | ORAL_TABLET | Freq: Once | ORAL | Status: AC
  Administered 2017-08-26: 40 mg via ORAL
  Filled 2017-08-26: qty 2

## 2017-08-26 NOTE — ED Triage Notes (Signed)
Itchy, burning rash to body x 1 day.  States was bitten by Animatorfire ant yesterday.

## 2017-08-26 NOTE — Discharge Instructions (Addendum)
Your exam is consistent with urticaria, of an unknown cause. Take the prescription antihistamines along with the steroid, as directed. Avoid getting overheated or taking hot showers. Drink to remain hydrated. Follow-up with one of the local community clinics for routine care, or return as needed. God bless you.
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
## 2017-08-26 NOTE — ED Notes (Signed)
Pt reports that she got bit on left lower leg/upper foot last night - since that bite she is having itching all over her body - she has not taken any medication to relieve the itching

## 2018-07-02 IMAGING — CR DG CHEST 2V
2 series · 2 of 2 positions shown · non-contrast
Comparison: Radiographs 03/26/2016

CLINICAL DATA: Cough for 2 months.

EXAM:
CHEST  2 VIEW

[chest pa]
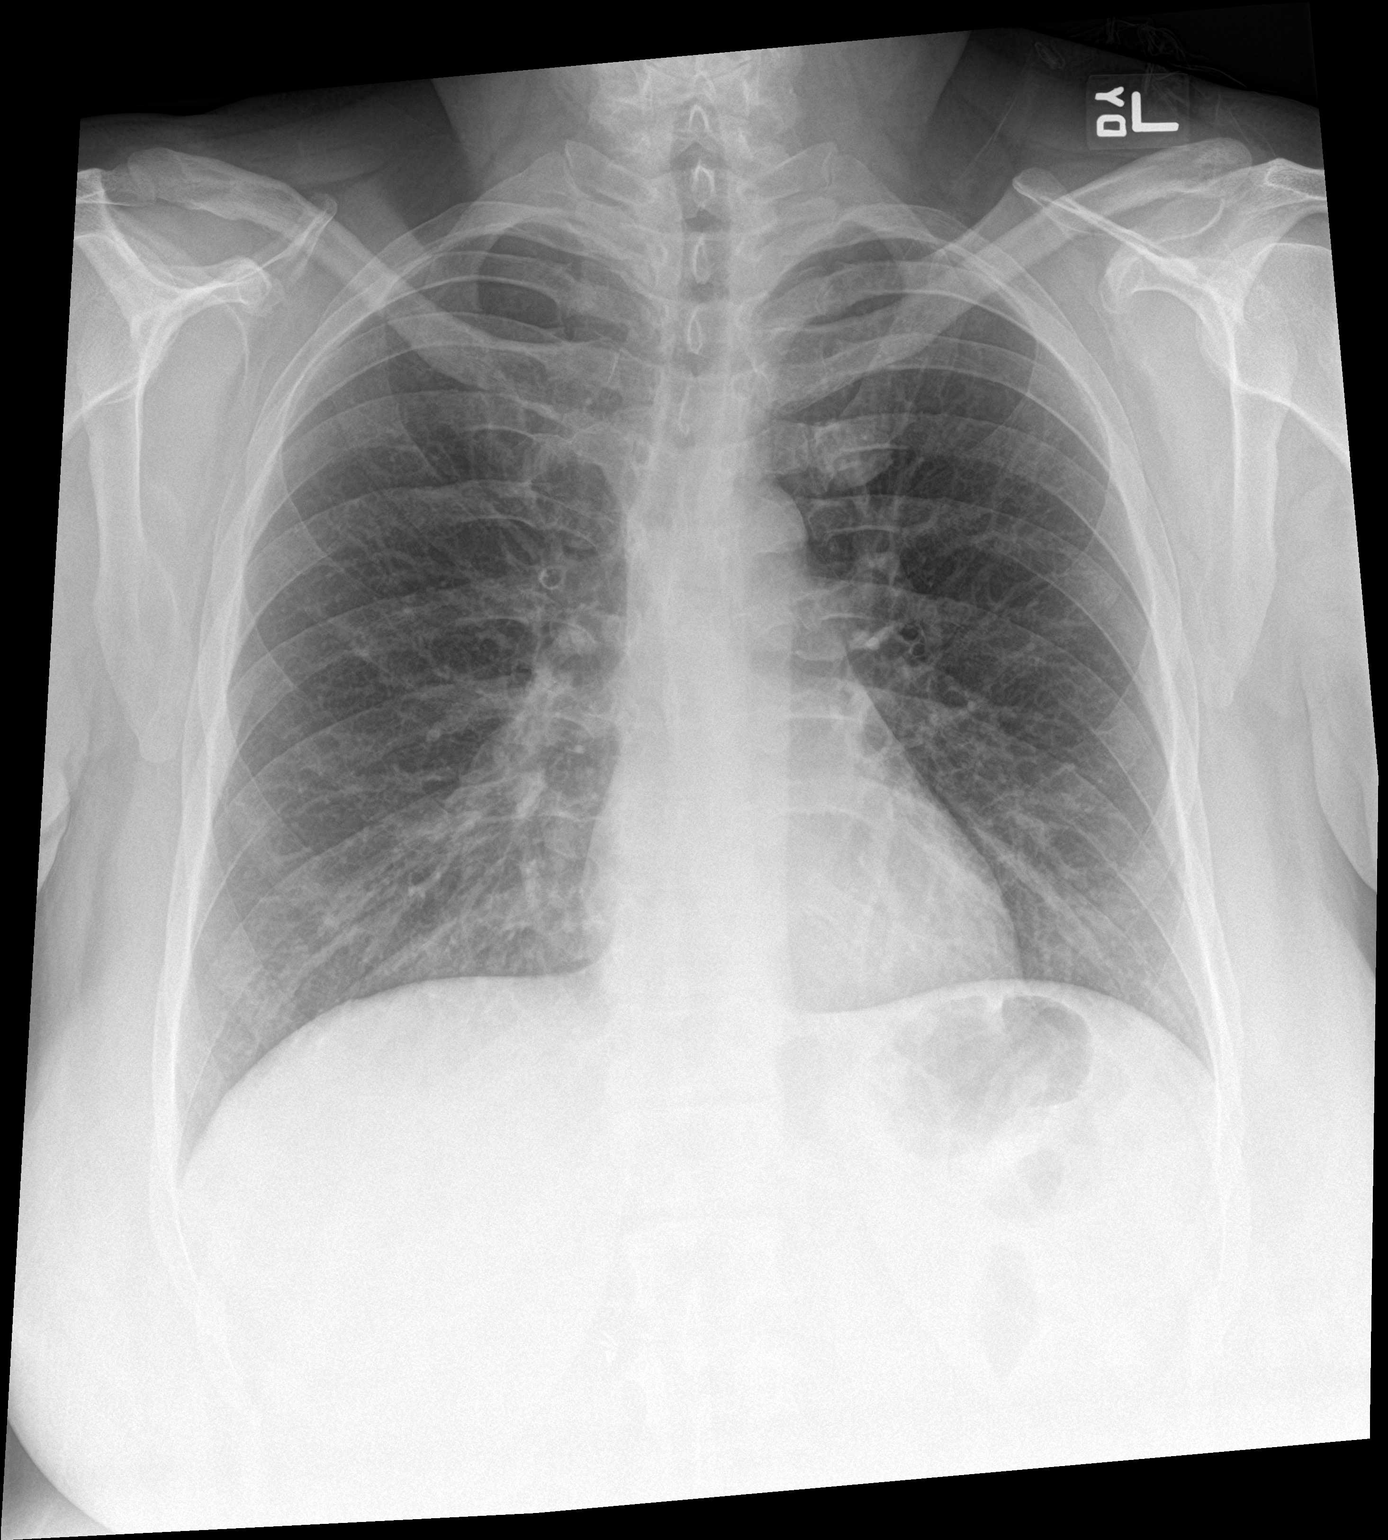

[chest lat]
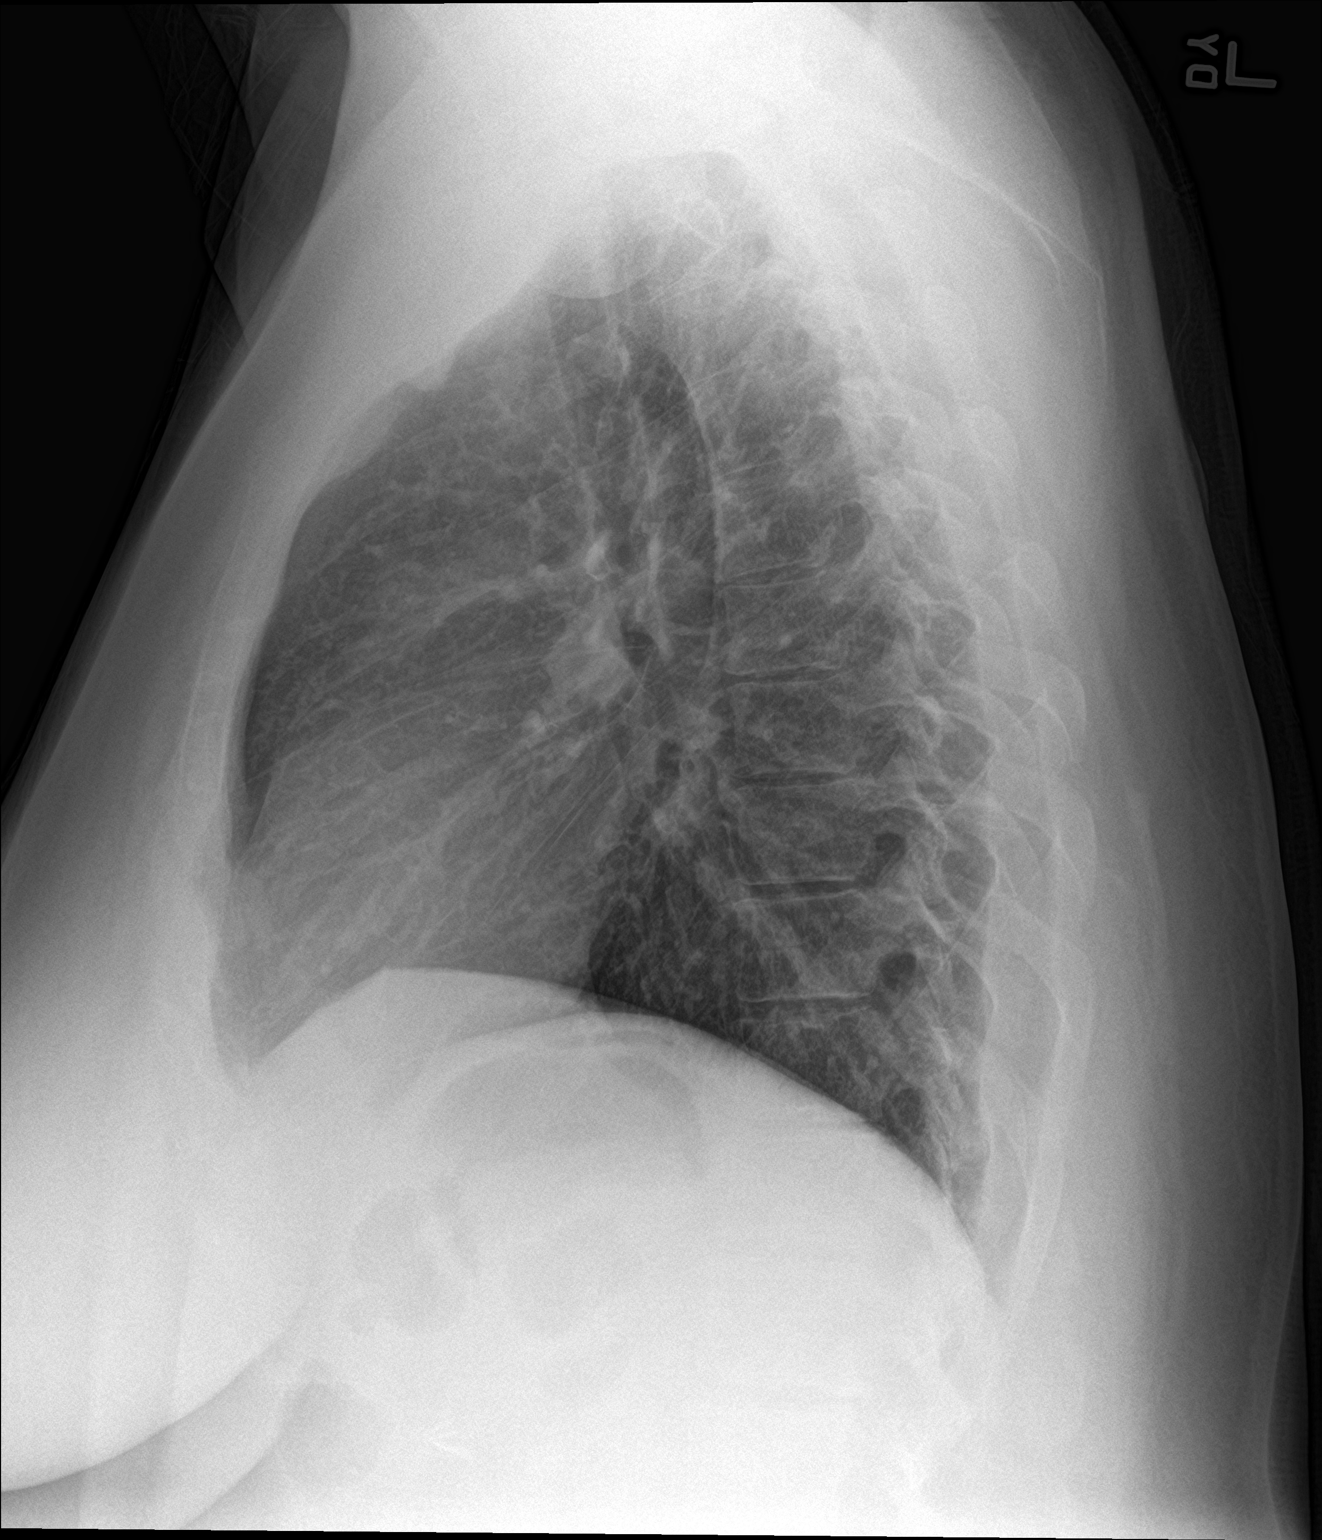

[2 of 2 positions shown; findings below may reference images not displayed]

FINDINGS: Previous rounded density in the right middle lobe has near
completely resolved with minimal residual atelectasis or scarring.
No evidence of underlying pulmonary mass. No new focal airspace
disease. Normal heart size and mediastinal contours. No pulmonary
edema, pleural fluid, or pneumothorax. No acute osseous abnormality.
IMPRESSION: No acute abnormality. Previous rounded density in the right middle
lobe has near completely resolved with minimal residual atelectasis
or scarring.

## 2020-08-21 ENCOUNTER — Emergency Department: Payer: Medicaid - Out of State

## 2020-08-21 ENCOUNTER — Emergency Department
Admission: EM | Admit: 2020-08-21 | Discharge: 2020-08-21 | Disposition: A | Discharge status: Home / Self Care | Payer: Medicaid - Out of State | Attending: Emergency Medicine | Admitting: Emergency Medicine

## 2020-08-21 ENCOUNTER — Other Ambulatory Visit: Payer: Self-pay

## 2020-08-21 DIAGNOSIS — I8311 Varicose veins of right lower extremity with inflammation: Secondary | ICD-10-CM | POA: Diagnosis not present

## 2020-08-21 DIAGNOSIS — M79651 Pain in right thigh: Secondary | ICD-10-CM | POA: Diagnosis present

## 2020-08-21 DIAGNOSIS — M79604 Pain in right leg: Secondary | ICD-10-CM

## 2020-08-21 NOTE — ED Triage Notes (Signed)
Pt here with a possible blood clot. Pt states right leg is painful and she feels a knot on her leg. Pt was recently for skin wounds on her leg. Pt stable in triage.e

## 2020-08-21 NOTE — ED Provider Notes (Signed)
[facility] Emergency Department Provider Note   ____________________________________________    I have reviewed the triage vital signs and the nursing notes.   HISTORY  Chief Complaint Leg Pain     HPI Lauren Haas is a 50 y.o. female who presents with complaints of pain to her right thigh.  Patient reports that she felt a "bump "in the anterior right thigh which she described as mildly tender with some erythema.  She reports erythema has improved.  She became concerned that it could be a blood clot because her niece was recently in the hospital with pulmonary emboli.  No shortness of breath, no pleurisy.  No calf swelling or pain.  Past Medical History:  Diagnosis Date   Ovarian cyst     There are no problems to display for this patient.   Past Surgical History:  Procedure Laterality Date   CESAREAN SECTION      Prior to Admission medications   Medication Sig Start Date End Date Taking? Authorizing Provider  albuterol (PROVENTIL HFA;VENTOLIN HFA) 108 (90 Base) MCG/ACT inhaler Inhale 1-2 puffs into the lungs every 6 (six) hours as needed for wheezing or shortness of breath. 01/11/16   Hagler, Jami L, PA-C  albuterol (PROVENTIL HFA;VENTOLIN HFA) 108 (90 Base) MCG/ACT inhaler Inhale 2 puffs into the lungs every 6 (six) hours as needed for wheezing or shortness of breath. 05/02/16   Phineas Semen, MD  fluticasone [facility]) 50 MCG/ACT nasal spray Place 2 sprays into both nostrils daily. 01/11/16   Hagler, Jami L, PA-C  ranitidine (ZANTAC) 150 MG tablet Take 1 tablet (150 mg total) by mouth 2 (two) times daily for 10 days. 08/26/17 09/05/17  Menshew, Charlesetta Ivory, PA-C     Allergies Patient has no known allergies.  No family history on file.  Social History Social History   Tobacco Use   Smoking status: Never   Smokeless tobacco: Never  Substance Use Topics   Alcohol use: No   Drug use: No    Review of Systems  Constitutional: No  fever/chills Eyes: No visual changes.  ENT: No sore throat. Cardiovascular: Denies chest pain. Respiratory: As above Gastrointestinal: No abdominal pain.  No nausea, no vomiting.   Genitourinary: Negative for dysuria. Musculoskeletal: As above Skin: Negative for rash. Neurological: Negative for headaches or weakness   ____________________________________________   PHYSICAL EXAM:  VITAL SIGNS: ED Triage Vitals  Enc Vitals Group     BP 08/21/20 1045 (!) 157/74     Pulse Rate 08/21/20 1045 82     Resp 08/21/20 1045 16     Temp 08/21/20 1045 98.1 F (36.7 C)     Temp Source 08/21/20 1045 Oral     SpO2 08/21/20 1045 97 %     Weight 08/21/20 1046 129.7 kg (286 lb)     Height 08/21/20 1046 1.778 m (5\' 10" )     Head Circumference --      Peak Flow --      Pain Score 08/21/20 1046 5     Pain Loc --      Pain Edu? --      Excl. in GC? --     Constitutional: Alert and oriented. No acute distress. Pleasant and interactive  Nose: No congestion/rhinnorhea. Mouth/Throat: Mucous membranes are moist.    Cardiovascular: Normal rate, regular rhythm. Grossly normal heart sounds.  Good peripheral circulation. Respiratory: Normal respiratory effort.  No retractions. Lungs CTAB.  Genitourinary: deferred Musculoskeletal: No lower extremity swelling, no  erythema, warm and well perfused, small area of very minimal tenderness to the right anterior thigh, no evidence of injury or bruising, question muscle sprain versus varicose veins which she does have Neurologic:  Normal speech and language. No gross focal neurologic deficits are appreciated.  Skin:  Skin is warm, dry and intact. No rash noted. Psychiatric: Mood and affect are normal. Speech and behavior are normal.  ____________________________________________   LABS (all labs ordered are listed, but only abnormal results are displayed)  Labs Reviewed - No data to  display ____________________________________________  EKG   ____________________________________________  RADIOLOGY  Ultrasound right lower extremity reviewed by me, no DVT ____________________________________________   PROCEDURES  Procedure(s) performed: No  Procedures   Critical Care performed: No ____________________________________________   INITIAL IMPRESSION / ASSESSMENT AND PLAN / ED COURSE  Pertinent labs & imaging results that were available during my care of the patient were reviewed by me and considered in my medical decision making (see chart for details).   Patient presents with symptoms as above, question muscle strain or spasm.  She is highly concerned about DVT, will obtain ultrasound to rule out DVT.  Ultrasound negative for DVT  Patient is relieved by this, suspect varicose veins as the cause of her discomfort, recommend warm compresses outpatient    ____________________________________________   FINAL CLINICAL IMPRESSION(S) / ED DIAGNOSES  Final diagnoses:  Right leg pain  Varicose veins of right lower extremity with inflammation        Note:  This document was prepared using Dragon voice recognition software and may include unintentional dictation errors.    Jene Every, MD 08/21/20 [id number]

## 2020-08-21 NOTE — ED Notes (Signed)
See triage note  Presents with pain with some swelling to right leg  States she is concerned that she may have a blood clot

## 2022-10-21 IMAGING — US US EXTREM LOW VENOUS*R*
1 series · 13 of 24 positions shown · non-contrast
Comparison: None.

CLINICAL DATA: Right thigh pain



[Series 1: us venous img lower uni right (dvt) · portal-venous · 13 of 37 slices shown]
[im 1/37]
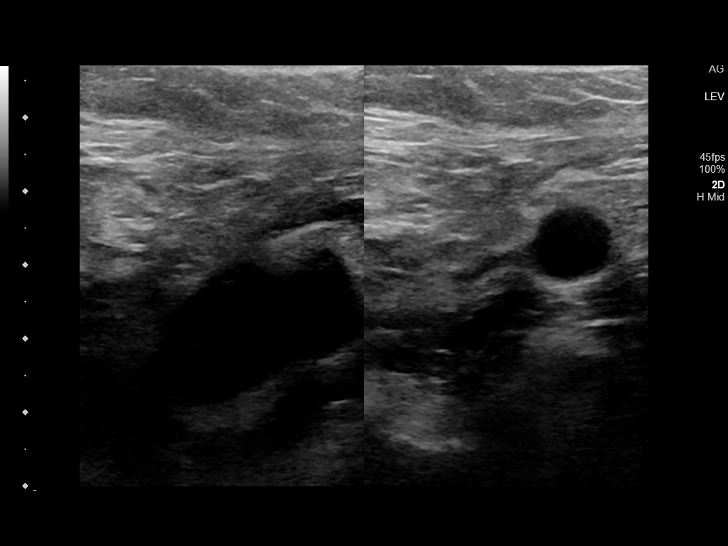
[im 4/37]
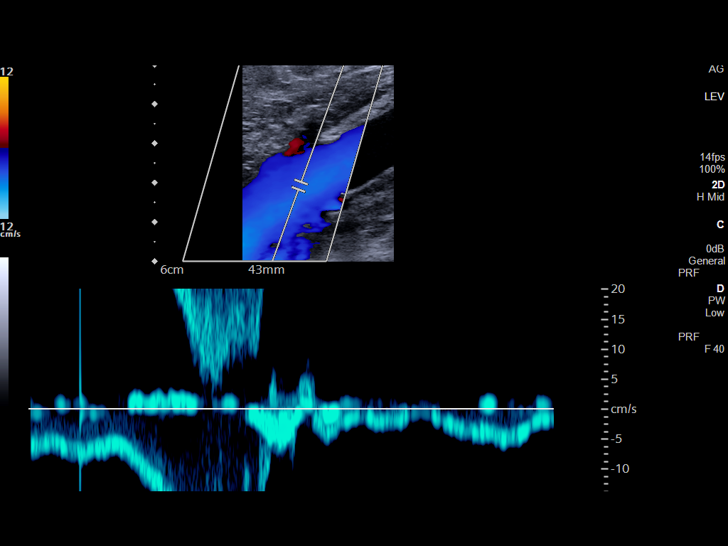
[im 7/37]
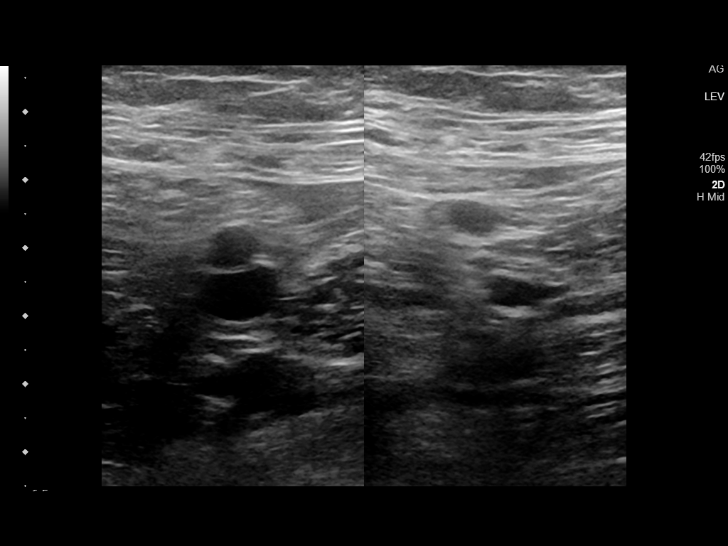
[im 10/37]
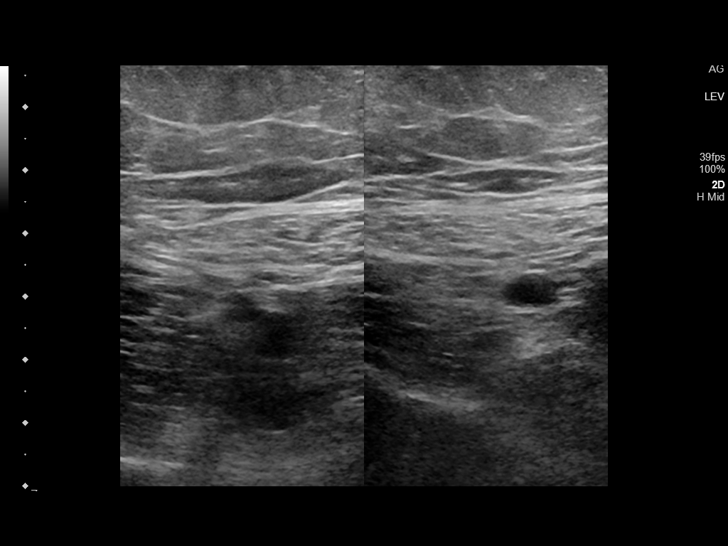
[im 13/37]
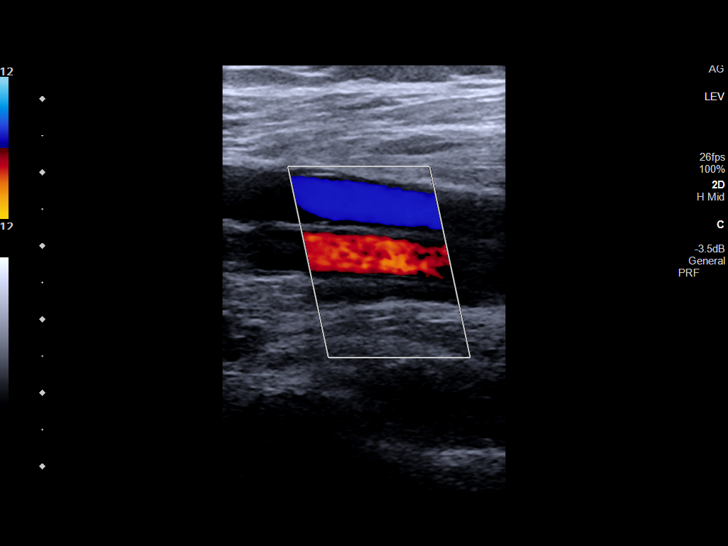
[im 16/37]
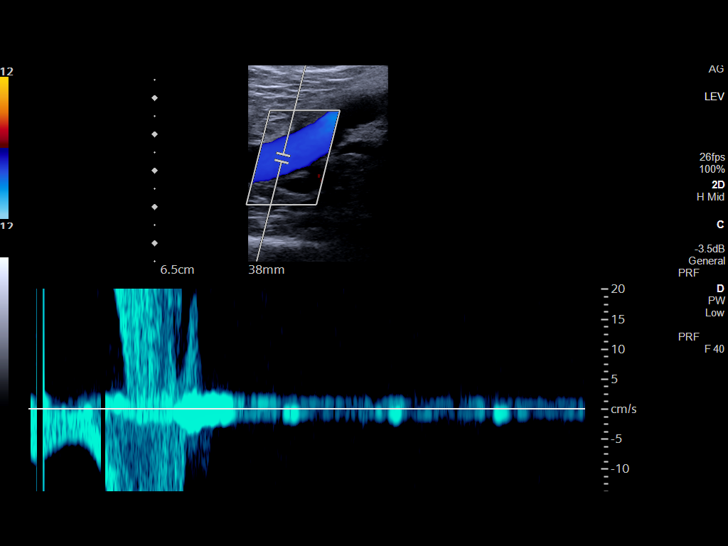
[im 19/37]
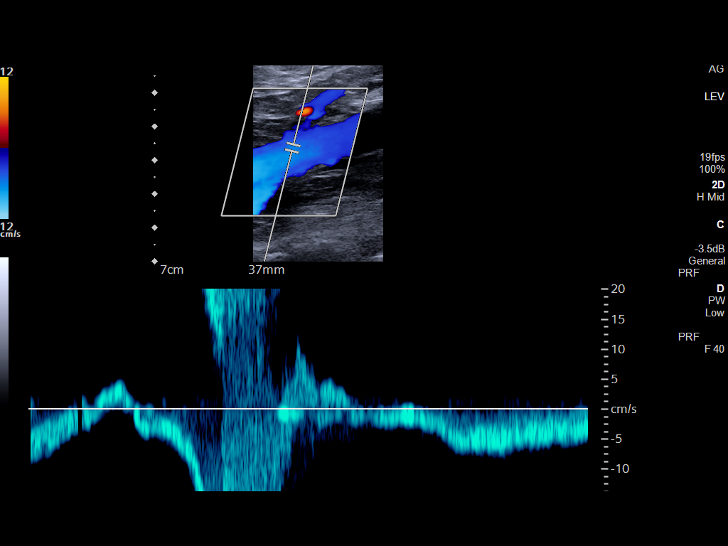
[im 21/37]
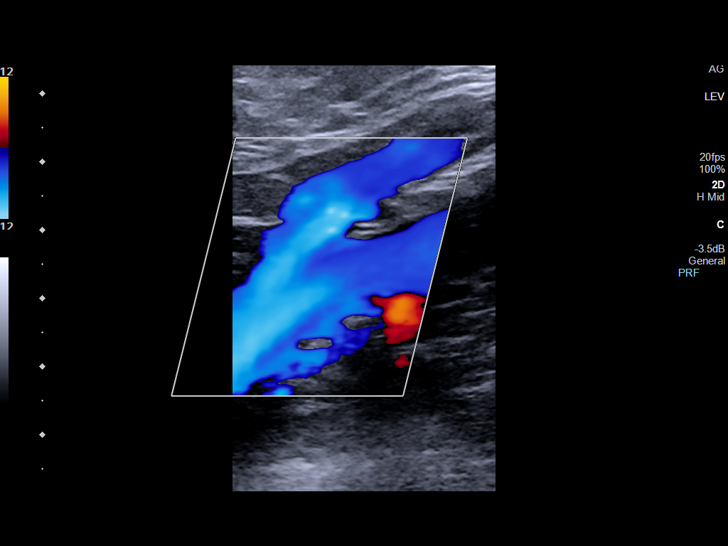
[im 24/37]
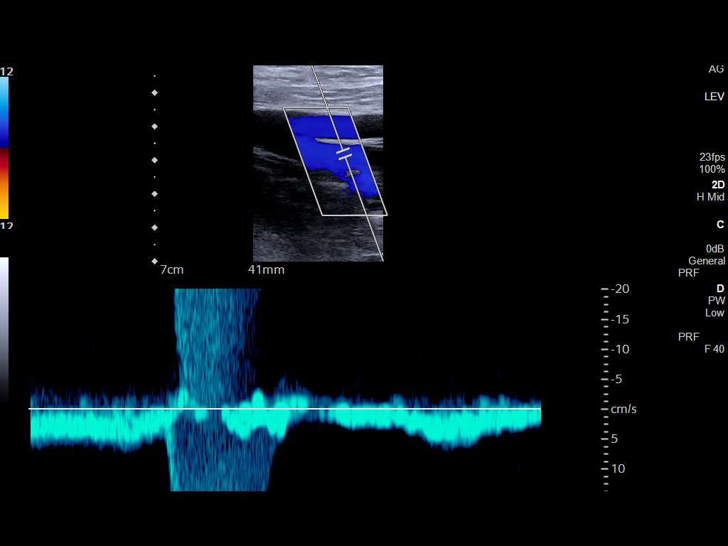
[im 27/37]
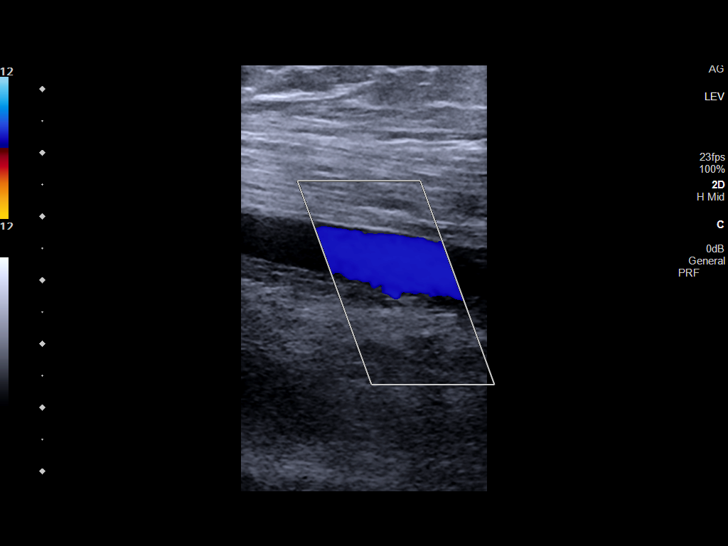
[im 30/37]
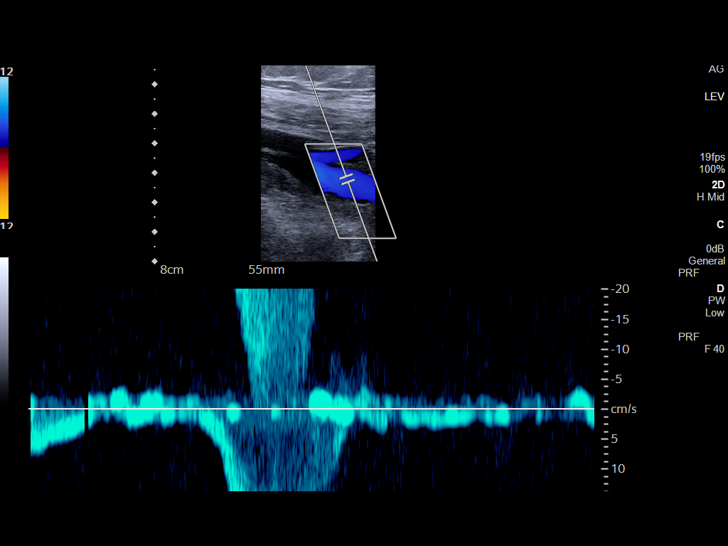
[im 33/37]
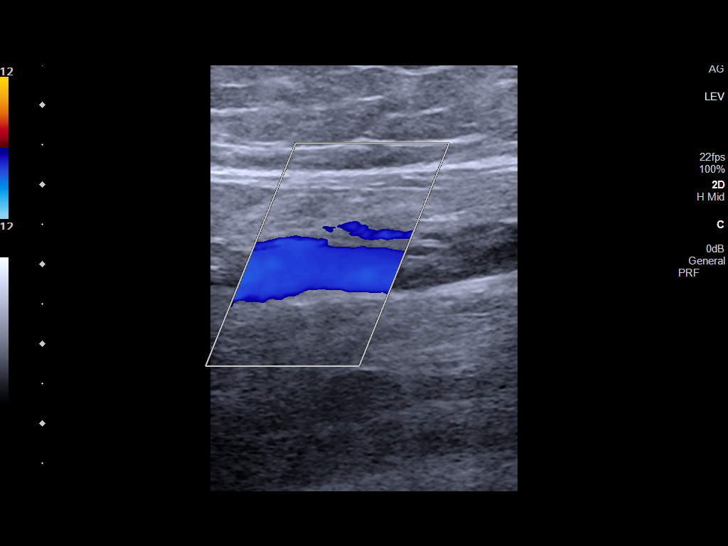
[im 37/37]
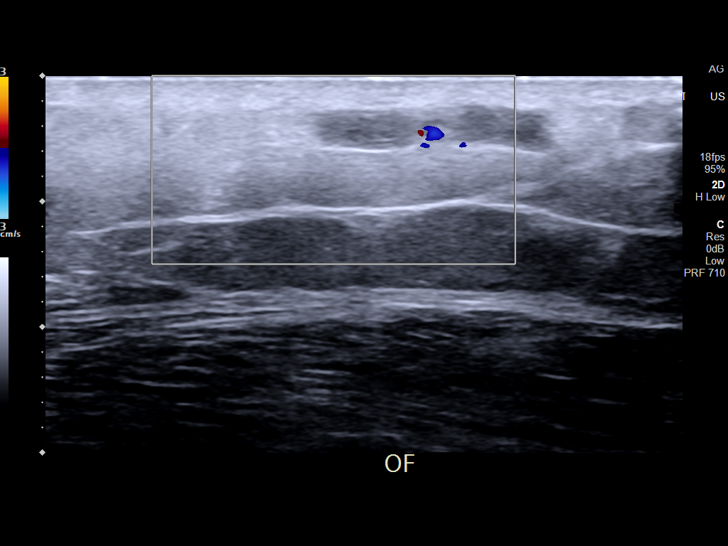

[13 of 24 positions shown; findings below may reference images not displayed]

FINDINGS: Contralateral Common Femoral Vein: Respiratory phasicity is normal
and symmetric with the symptomatic side. No evidence of thrombus.
Normal compressibility.

Common Femoral Vein: No evidence of thrombus. Normal
compressibility, respiratory phasicity and response to augmentation.

Saphenofemoral Junction: No evidence of thrombus. Normal
compressibility and flow on color Doppler imaging.

Profunda Femoral Vein: No evidence of thrombus. Normal
compressibility and flow on color Doppler imaging.

Femoral Vein: No evidence of thrombus. Normal compressibility,
respiratory phasicity and response to augmentation.

Popliteal Vein: No evidence of thrombus. Normal compressibility,
respiratory phasicity and response to augmentation.

Calf Veins: No evidence of thrombus. Normal compressibility and flow
on color Doppler imaging.

Superficial Great Saphenous Vein: No evidence of thrombus. Normal
compressibility.

Other Findings: Right lateral thigh area of concern and pain
demonstrates a superficial varicosity with hypoechoic thrombus.
Varicose vein is noncompressible. Appearance compatible with
superficial thrombosis/thrombophlebitis.
IMPRESSION: Negative for right lower extremity DVT.

Right lateral thigh sub surface varicose vein superficial
thrombosis/thrombophlebitis.
